# Patient Record
Sex: Female | Born: 1990 | Race: White | Hispanic: No | Marital: Married | Smoking: Never smoker
Health system: Southern US, Community
[De-identification: ages and names within clinical notes are randomized; demographics above are authoritative.]

## PROBLEM LIST (undated history)

## (undated) DIAGNOSIS — F329 Major depressive disorder, single episode, unspecified: Secondary | ICD-10-CM

## (undated) DIAGNOSIS — F32A Depression, unspecified: Secondary | ICD-10-CM

## (undated) HISTORY — DX: Major depressive disorder, single episode, unspecified: F32.9

## (undated) HISTORY — DX: Depression, unspecified: F32.A

---
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
## 2012-10-07 ENCOUNTER — Ambulatory Visit: Payer: Self-pay | Admitting: Family Medicine

## 2012-11-04 ENCOUNTER — Ambulatory Visit: Payer: Self-pay | Admitting: Family Medicine

## 2013-05-21 ENCOUNTER — Ambulatory Visit: Payer: Self-pay | Admitting: Family Medicine

## 2013-06-18 ENCOUNTER — Ambulatory Visit (INDEPENDENT_AMBULATORY_CARE_PROVIDER_SITE_OTHER): Payer: 59 | Admitting: Psychiatry

## 2013-06-18 ENCOUNTER — Encounter (INDEPENDENT_AMBULATORY_CARE_PROVIDER_SITE_OTHER): Payer: Self-pay

## 2013-06-18 ENCOUNTER — Encounter (HOSPITAL_COMMUNITY): Payer: Self-pay | Admitting: Psychiatry

## 2013-06-18 DIAGNOSIS — F329 Major depressive disorder, single episode, unspecified: Secondary | ICD-10-CM | POA: Insufficient documentation

## 2013-06-18 DIAGNOSIS — F411 Generalized anxiety disorder: Secondary | ICD-10-CM

## 2013-06-18 MED ORDER — MELATONIN 5 MG PO TABS: 5.0000 mg | ORAL_TABLET | Freq: Every evening | ORAL | Status: AC | PRN

## 2013-06-18 MED ORDER — CITALOPRAM HYDROBROMIDE 20 MG PO TABS: 20.0000 mg | ORAL_TABLET | Freq: Every day | ORAL | Status: DC
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
## 2013-06-18 NOTE — Patient Instructions (Signed)
Continue therapy at [facility]  Have labs faxed to us at [phone]

## 2013-08-20 ENCOUNTER — Ambulatory Visit (INDEPENDENT_AMBULATORY_CARE_PROVIDER_SITE_OTHER): Payer: 59 | Admitting: Psychiatry

## 2013-08-20 ENCOUNTER — Encounter (HOSPITAL_COMMUNITY): Payer: Self-pay | Admitting: Psychiatry

## 2013-08-20 VITALS — BP 125/76 | HR 95 | Ht 69.0 in | Wt 139.8 lb

## 2013-08-20 DIAGNOSIS — F341 Dysthymic disorder: Secondary | ICD-10-CM

## 2013-08-20 DIAGNOSIS — F3181 Bipolar II disorder: Secondary | ICD-10-CM | POA: Insufficient documentation

## 2013-08-20 MED ORDER — LITHIUM CARBONATE 300 MG PO TABS: 450.0000 mg | ORAL_TABLET | Freq: Every day | ORAL | Status: DC
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
## 2013-08-27 ENCOUNTER — Telehealth (HOSPITAL_COMMUNITY): Payer: Self-pay

## 2013-08-27 NOTE — Telephone Encounter (Signed)
Pt reports she researched lithium and feels scared b/c she doesn't want to be on it lifelong. Pt has not yet started taking.  A/P: MDD with anxiety, r/o Bipolar II 1. Pt will start Lithium as prescribed.

## 2013-10-22 ENCOUNTER — Ambulatory Visit (HOSPITAL_COMMUNITY): Payer: 59 | Admitting: Psychiatry

## 2013-10-29 ENCOUNTER — Encounter (HOSPITAL_COMMUNITY): Payer: Self-pay | Admitting: Psychiatry

## 2013-10-29 ENCOUNTER — Ambulatory Visit (INDEPENDENT_AMBULATORY_CARE_PROVIDER_SITE_OTHER): Payer: 59 | Admitting: Psychiatry

## 2013-10-29 DIAGNOSIS — F3181 Bipolar II disorder: Secondary | ICD-10-CM

## 2013-10-29 DIAGNOSIS — F341 Dysthymic disorder: Secondary | ICD-10-CM

## 2013-10-29 MED ORDER — LITHIUM CARBONATE 300 MG PO TABS: 600.0000 mg | ORAL_TABLET | Freq: Every day | ORAL | Status: DC

## 2013-10-29 NOTE — Progress Notes (Signed)
Patient ID: Beth Jacobson, female   DOB: 04-27-1990, 23 y.o.   MRN: [id number]  [facility] [id number] Progress Note  Beth Jacobson [id number] 23 y.o.  10/29/2013 3:43 PM  Chief Complaint: "things were good until school started"  History of Present Illness: Pt reports she was doing well until 3 weeks ago when school started. School work is going fine but socially things are not going well. 2 friends told her they are afraid of her and don't know what she will do. Pt denies violent behavior. No issues with other friends. Relationship with boyfriend is rocky and she is concerned he wants to break up with her.   Pt is in soccer and they are not playing her at games despite good performance. Pt is concerned b/c the Clydie Braun shared pt's mental health issues with the coach. She feels she is being targeted b/c of her depression. Pt is looking into options right now.  Depression is worse lately. Pt is having crying spells and feels helpless. Sleep, appetite and energy are good. Denies anhedonia and isolation.   Denies periods of mania and hypomania included decreased need for sleep and increased energy, elevated mood and impulsivity.   She is seeing a counselor every week and it is helping.   Pt is taking Lithium as prescribed and denies SE. States mood is more level and she is calmer.   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  She is studying biology and wants to be a Administrator, Civil Service. Parents live in [place] and she has one younger brother. Pt denies drug and nicotine use. She drinks a variable amount of alcohol socially. Endorsing a family hx of bipolar disorder and personality disorder. History reviewed. No pertinent past medical history.  Outpatient Encounter Prescriptions as of 10/29/2013  Medication Sig  . Dapsone (ACZONE) 5 % topical gel Apply 1 g topically daily.  Marland Kitchen lithium 300 MG tablet Take 1.5 tablets (450 mg total) by mouth daily.  . Melatonin 5 MG TABS Take 1 tablet (5 mg total) by mouth at bedtime as needed.  . norethindrone-ethinyl estradiol-iron (ESTROSTEP FE,TILIA FE,TRI-LEGEST FE) 1-20/1-30/1-35 MG-MCG tablet Take 1 tablet by mouth daily.    Past Psychiatric History/Hospitalization(s): Pt has a hx of cutting that last occurred in Feb 2015. Anxiety: No Bipolar Disorder: No Depression: No Mania: No Psychosis: No Schizophrenia: No Personality Disorder: No Hospitalization for psychiatric illness: No History of Electroconvulsive Shock Therapy: No Prior Suicide Attempts: No  Physical Exam: Constitutional:  There were no vitals taken for this visit.  General Appearance: alert, oriented, no acute distress and well nourished  Musculoskeletal: Strength & Muscle Tone: within normal limits Gait & Station: normal Patient leans: N/A  Mental Status Examination/Evaluation: Objective: Attitude: Calm and cooperative  Appearance: Casual, appears to be stated age  Eye Contact::  Good  Speech:  Clear and Coherent and Normal Rate  Volume:  Normal  Mood:  depressed  Affect:  Congruent and Tearful  Thought Process:  Circumstantial  Orientation:  Full (Time, Place, and Person)  Thought Content:  Negative  Suicidal Thoughts:  No  Homicidal Thoughts:  No  Judgement:  Other:  limited  Insight:  Shallow. Pt requires significant reassurance regarding her social issues  Concentration: good  Memory:  Immediate-good Recent-good Remote-good  Recall: fair  Language: fair  Gait and Station: normal  [employer] of Knowledge: average  Psychomotor Activity:  Normal  Akathisia:  No  Handed:  Right  AIMS (if indicated):  n/a    Medical Decision Making (Choose Three): Review of Psycho-Social Stressors (1), Established Problem, Worsening (2), Review of Medication Regimen & Side Effects (2) and Review of New Medication or Change in Dosage (2)  Assessment: AXIS I: MDD with anxiety; r/o Bipolar II disorder  AXIS II:  Deferred  AXIS III: History reviewed. No pertinent past medical history.  AXIS IV: other psychosocial or environmental problems  AXIS V: 61-70 mild symptoms    Plan:  Plan of Care:  Medication management with supportive therapy. Risks/benefits and SE of the medication discussed. Pt verbalized understanding and verbal consent obtained for treatment.  Affirm with the patient that the medications are taken as ordered. Patient expressed understanding of how their medications were to be used.  - worsening of symptoms    Laboratory: none at this time  Psychotherapy: Therapy: brief supportive therapy provided. Discussed psychosocial stressors in detail.  We had a long discussion about her relationship with her friends and boyfriend and talked about appropriate boundaries and healthy relationships in detail.   Medications:  Increase Lithium  po qD for mood stabalziation  Continue Melatonin OTC as needed for sleep as pt reports it is effective.   Routine PRN Medications: Yes   Consultations: encouraged to continue therapy.   Safety Concerns: Pt denies SI and is at an acute low risk for suicide.Patient told to call clinic if any problems occur. Patient advised to go to ER if they should develop SI/HI, side effects, or if symptoms worsen. Has crisis numbers to call if needed. Pt verbalized understanding.   Other: F/up in 3 months or sooner if needed     Oletta Darter,  MD 10/29/2013

## 2013-11-12 ENCOUNTER — Telehealth (HOSPITAL_COMMUNITY): Payer: Self-pay

## 2014-01-21 ENCOUNTER — Ambulatory Visit (INDEPENDENT_AMBULATORY_CARE_PROVIDER_SITE_OTHER): Payer: Commercial Managed Care - PPO | Admitting: Psychiatry

## 2014-01-21 ENCOUNTER — Encounter (HOSPITAL_COMMUNITY): Payer: Self-pay | Admitting: Psychiatry

## 2014-01-21 VITALS — BP 126/79 | HR 89 | Ht 69.0 in | Wt 144.8 lb

## 2014-01-21 DIAGNOSIS — F3181 Bipolar II disorder: Secondary | ICD-10-CM

## 2014-01-21 DIAGNOSIS — Z79899 Other long term (current) drug therapy: Secondary | ICD-10-CM

## 2014-01-21 MED ORDER — LITHIUM CARBONATE 300 MG PO TABS: 600.0000 mg | ORAL_TABLET | Freq: Every day | ORAL | Status: AC

## 2014-01-21 NOTE — Progress Notes (Signed)
[facility] [id number] [facility] Note  Beth Dickerrin Knock [id number] 23 y.o.  01/21/2014 3:19 PM  Chief Complaint: "pretty good"  History of Present Illness: States her soccer coach was concerned about her depression and pt's ability to perform. He wasn't playing her in games so pt decided to take the rest of the season off.  It was upsetting to her that her coach held her depression against her. Now she is feeling better. Pt broke up with her boyfriend and she is happier. Things are good with friends and she has downgraded some people to acquaints.   Depression comes and goes. This occurs about once a week. She has sad mood, low motivation, low energy, no desire to get out of bed, low appetite. It lasts for a few hours and resolves on its on.   Denies anhedonia, isolation, crying spells, withdrawal. Sleeping about 7-8 hrs and energy is good. Appetite and concentration are good.   Over thanksgiving she has 4-5 days of decreased sleep, increased energy, impulsive behavior, elevated mood, sexually inappropriate behavior. Symptoms resolved on its on.   She is seeing a counselor every week and it is helping.   Pt is taking Lithium as prescribed and denies SE. States mood is more level and she is calmer.   Suicidal Ideation: No Plan Formed: No Patient has means to carry out plan: No  Homicidal Ideation: No Plan Formed: No Patient has means to carry out plan: No  Review of Systems: Psychiatric: Agitation: No Hallucination: No Depressed Mood: No Insomnia: No Hypersomnia: No Altered Concentration: No Feels Worthless: No Grandiose Ideas: Yes Belief In Special Powers: No New/Increased Substance Abuse: No Compulsions: No  Neurologic: Headache: No Seizure: No Paresthesias: No   Review of Systems  Constitutional: Negative for fever and chills.  HENT: Negative for congestion, nosebleeds and sore throat.   Eyes: Negative for blurred vision, double vision and discharge.   Respiratory: Negative for cough, sputum production and shortness of breath.   Cardiovascular: Negative for chest pain, palpitations and leg swelling.  Gastrointestinal: Negative for heartburn, nausea, vomiting, abdominal pain, diarrhea and constipation.  Musculoskeletal: Negative for myalgias, back pain, joint pain and neck pain.  Skin: Negative for itching and rash.  Neurological: Negative for dizziness, tingling, seizures, loss of consciousness, weakness and headaches.  Psychiatric/Behavioral: Negative for depression, suicidal ideas, hallucinations and substance abuse. The patient is not nervous/anxious and does not have insomnia.      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 She is studying biology and wants to be a Administrator, [employer] Service[employer]et. [place] in [place] and she has one younger brother. Pt denies drug and nicotine use. She drinks a variable amount of alcohol socially. Endorsing a family hx of bipolar disorder and personality disorder. Past Medical History  Diagnosis Date  . Depression     Outpatient Encounter Prescriptions as of 01/21/2014  Medication Sig  . Dapsone (ACZONE) 5 % topical gel Apply 1 g topically daily.  Marland Kitchen. lithium 300 MG tablet Take 2 tablets (600 mg total) by mouth daily.  . Melatonin 5 MG TABS Take 1 tablet (5 mg total) by mouth at bedtime as needed.  . norethindrone-ethinyl estradiol-iron (ESTROSTEP FE,TILIA FE,TRI-LEGEST FE) 1-20/1-30/1-35 MG-MCG tablet Take 1 tablet by mouth daily.    Past Psychiatric History/Hospitalization(s): Pt has a hx of cutting that last occurred in Feb 2015. Anxiety: No Bipolar Disorder: No Depression: No Mania: No Psychosis: No Schizophrenia: No Personality Disorder: No Hospitalization  for psychiatric illness: No History of Electroconvulsive Shock Therapy: No Prior Suicide Attempts: No  Physical Exam: Constitutional:  There were no vitals taken for this visit.  General Appearance:  alert, oriented, no acute distress and well nourished  Musculoskeletal: Strength & Muscle Tone: within normal limits Gait & Station: normal Patient leans: N/A  Mental Status Examination/Evaluation: Objective: Attitude: Calm and cooperative  Appearance: Casual, appears to be stated age  Eye Contact::  Good  Speech:  Clear and Coherent and Normal Rate  Volume:  Normal  Mood:  euthymic  Affect:  Full Range  Thought Process:  Linear and Logical  Orientation:  Full (Time, Place, and Person)  Thought Content:  Negative  Suicidal Thoughts:  No  Homicidal Thoughts:  No  Judgement:  Good  Insight:  Fair  Concentration: good  Memory: Immediate-fair Recent-fair Remote-fair  Recall: fair  Language: fair  Gait and Station: normal  [employer] of Knowledge: average  Psychomotor Activity:  Normal  Akathisia:  No  Handed:  Right  AIMS (if indicated):  n/a  Assets:  Manufacturing systems engineerCommunication Skills Desire for Improvement Financial Resources/Insurance Housing Leisure Time Physical Health Resilience Social Support BaristaTalents/Skills Transportation Vocational/Educational    Medical Decision Making (Choose Three): Established Problem, Stable/Improving (1), Review of Psycho-Social Stressors (1), Review or order clinical lab tests (1) and Review of Medication Regimen & Side Effects (2)  Assessment: AXIS I: Bipolar II disorder - current episode unspecifiend AXIS II:  Deferred  AXIS III:  Past Medical History  Diagnosis Date  . Depression    AXIS IV: other psychosocial or environmental problems  AXIS V: 61-70 mild symptoms    Plan:  Plan of Care:  Medication management with supportive therapy. Risks/benefits and SE of the medication discussed. Pt verbalized understanding and verbal consent obtained for treatment.  Affirm with the patient that the medications are taken as ordered. Patient expressed understanding of how their medications were to be used.  - improvement of symptoms     Laboratory: Lithium- Lithium level, CMP with Bun/Creatinine, TSH   Psychotherapy: Therapy: brief supportive therapy provided. Discussed psychosocial stressors in detail.  We had a long discussion about her relationship with her friends and boyfriend and talked about appropriate boundaries and healthy relationships in detail.   Medications:  Lithium 600mg  po qD for mood stabalziation  Continue Melatonin OTC as needed for sleep as pt reports it is effective.   Routine PRN Medications: Yes   Consultations: encouraged to continue therapy.   Safety Concerns: Pt denies SI and is at an acute low risk for suicide.Patient told to call clinic if any problems occur. Patient advised to go to ER if they should develop SI/HI, side effects, or if symptoms worsen. Has crisis numbers to call if needed. Pt verbalized understanding.   Other: F/up in 3 months or sooner if needed   Pt asking for letter for soccer- due to MH issues pt was not able to finish out her season. Pt is giving permission disclose diagnosis, treatment, symptoms.     Oletta DarterAGARWAL, Ngina Royer, MD 01/21/2014

## 2014-02-05 ENCOUNTER — Telehealth (HOSPITAL_COMMUNITY): Payer: Self-pay

## 2014-02-08 ENCOUNTER — Encounter (HOSPITAL_COMMUNITY): Payer: Self-pay | Admitting: *Deleted

## 2014-02-09 NOTE — Telephone Encounter (Signed)
Spoke with pt who stated letter needed clarification regarding her opting out of this years soccer team.   Discussed with pt and rewrote the letter with her approval.

## 2014-06-08 ENCOUNTER — Ambulatory Visit
Admit: 2014-06-08 | Disposition: A | Discharge status: Home / Self Care | Payer: Self-pay | Attending: Family Medicine | Admitting: Family Medicine

## 2014-09-24 DIAGNOSIS — M62838 Other muscle spasm: Secondary | ICD-10-CM | POA: Diagnosis not present

## 2014-09-24 DIAGNOSIS — M545 Low back pain: Secondary | ICD-10-CM | POA: Diagnosis not present

## 2014-12-14 ENCOUNTER — Ambulatory Visit
Admission: RE | Admit: 2014-12-14 | Discharge: 2014-12-14 | Disposition: A | Discharge status: Home / Self Care | Payer: Commercial Managed Care - PPO | Source: Ambulatory Visit | Attending: Family Medicine | Admitting: Family Medicine

## 2014-12-14 ENCOUNTER — Other Ambulatory Visit: Payer: Self-pay | Admitting: Family Medicine

## 2014-12-14 DIAGNOSIS — M25561 Pain in right knee: Secondary | ICD-10-CM | POA: Insufficient documentation

## 2014-12-14 DIAGNOSIS — S20219A Contusion of unspecified front wall of thorax, initial encounter: Secondary | ICD-10-CM | POA: Diagnosis not present

## 2014-12-14 DIAGNOSIS — R52 Pain, unspecified: Secondary | ICD-10-CM

## 2014-12-14 DIAGNOSIS — R0781 Pleurodynia: Secondary | ICD-10-CM | POA: Insufficient documentation

## 2014-12-21 DIAGNOSIS — S20219A Contusion of unspecified front wall of thorax, initial encounter: Secondary | ICD-10-CM | POA: Diagnosis not present

## 2014-12-21 DIAGNOSIS — M25561 Pain in right knee: Secondary | ICD-10-CM | POA: Diagnosis not present

## 2014-12-23 ENCOUNTER — Other Ambulatory Visit: Payer: Self-pay | Admitting: Family Medicine

## 2014-12-23 DIAGNOSIS — M25561 Pain in right knee: Secondary | ICD-10-CM

## 2015-01-03 ENCOUNTER — Ambulatory Visit: Payer: Commercial Managed Care - PPO

## 2017-03-30 IMAGING — CR DG KNEE COMPLETE 4+V*R*
1 series · 4 of 4 positions shown · non-contrast
Comparison: None.

CLINICAL DATA: Medial right knee pain after soccer injury 3 weeks
ago. Initial encounter.

EXAM:
RIGHT KNEE - COMPLETE 4+ VIEW

[Series 1: dg knee complete 4 views right · 0.14mm/px · 4 of 4 slices shown]
[im 1/4]
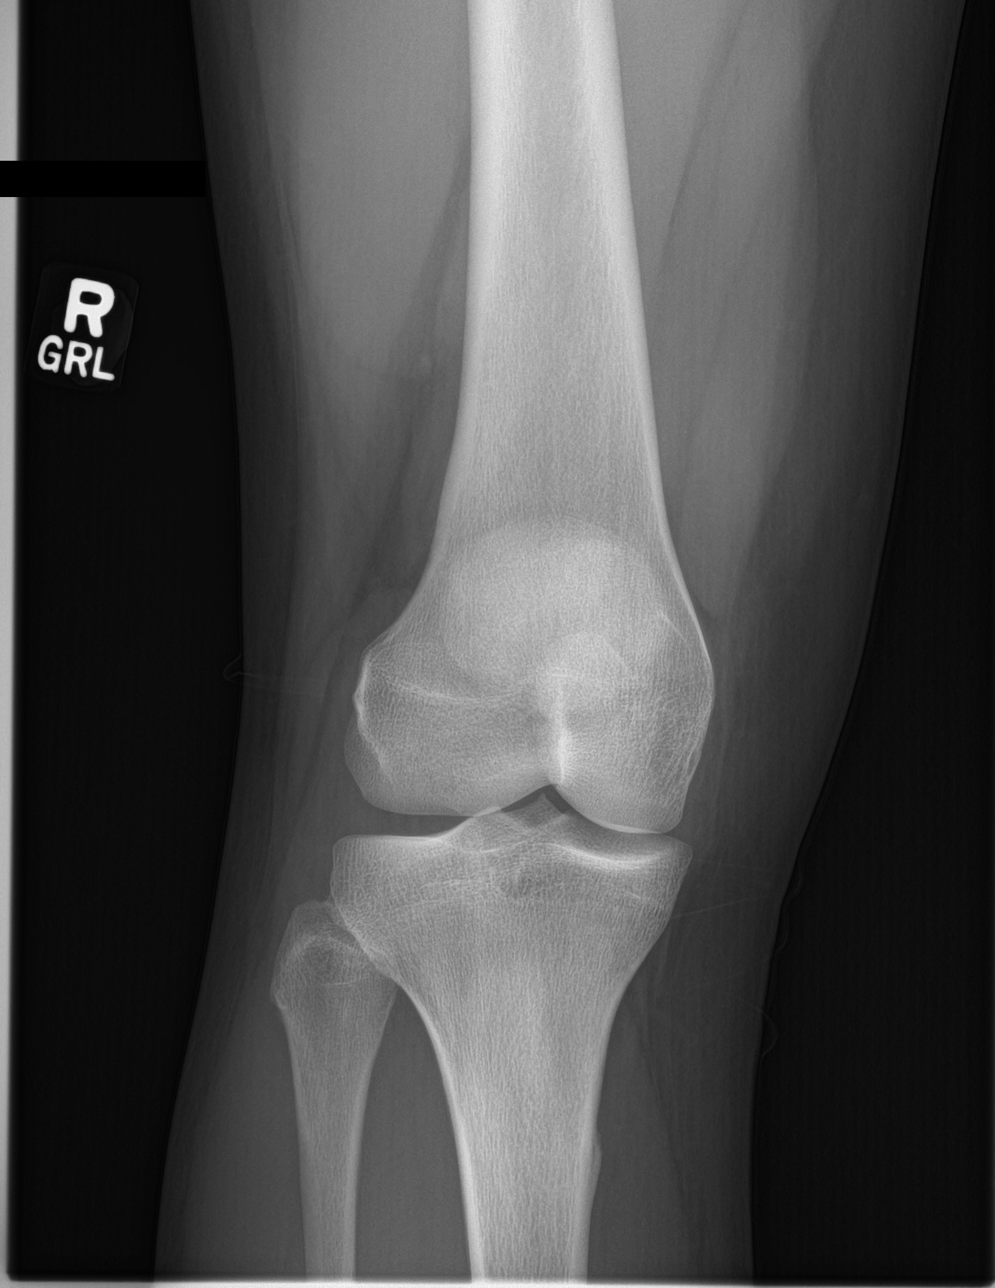
[im 2/4]
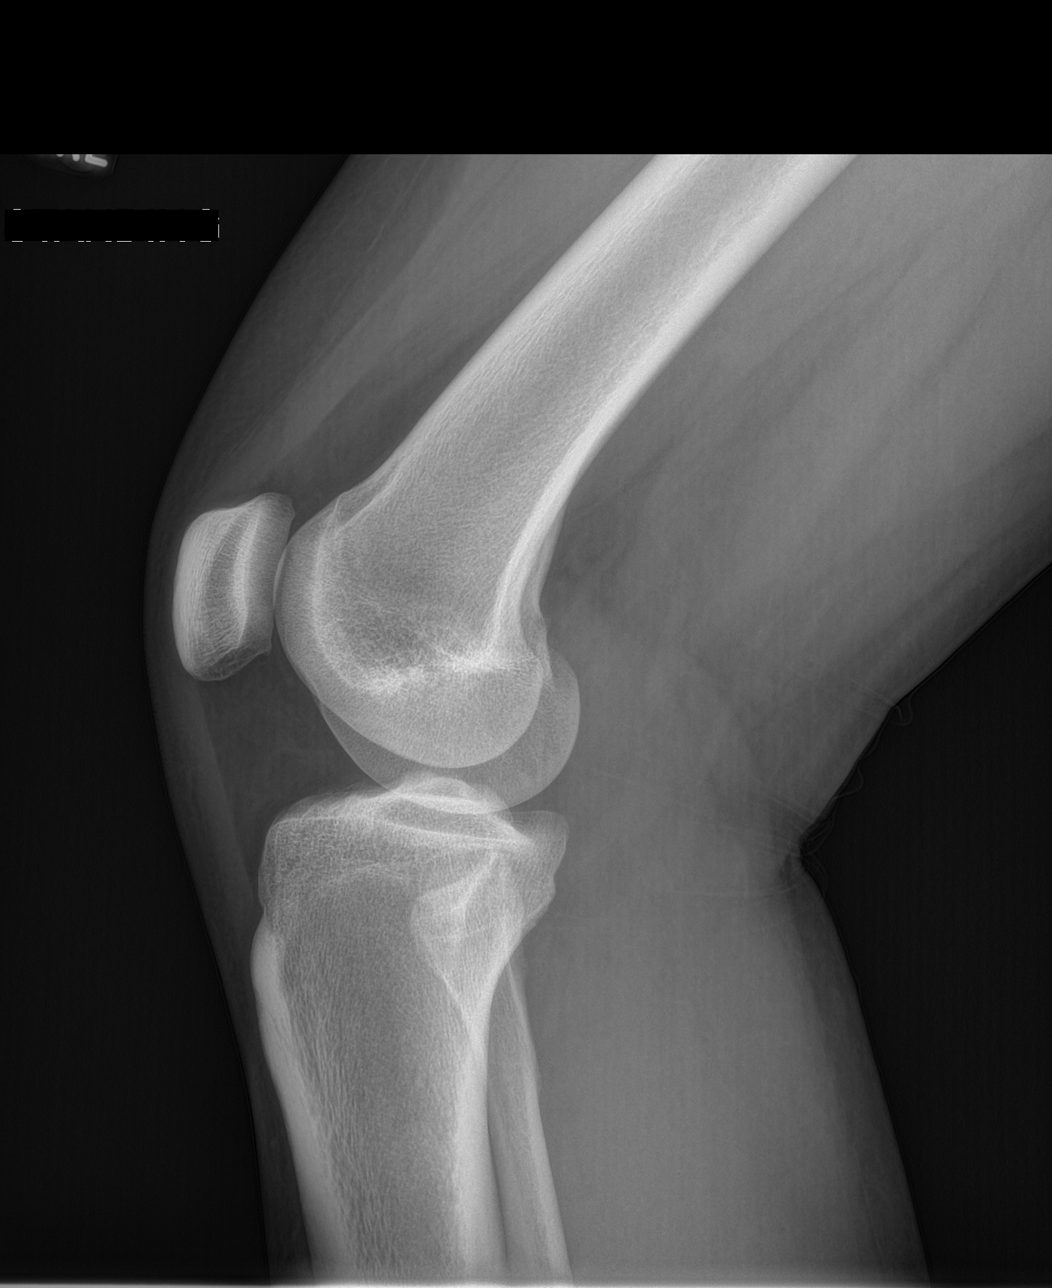
[im 3/4]
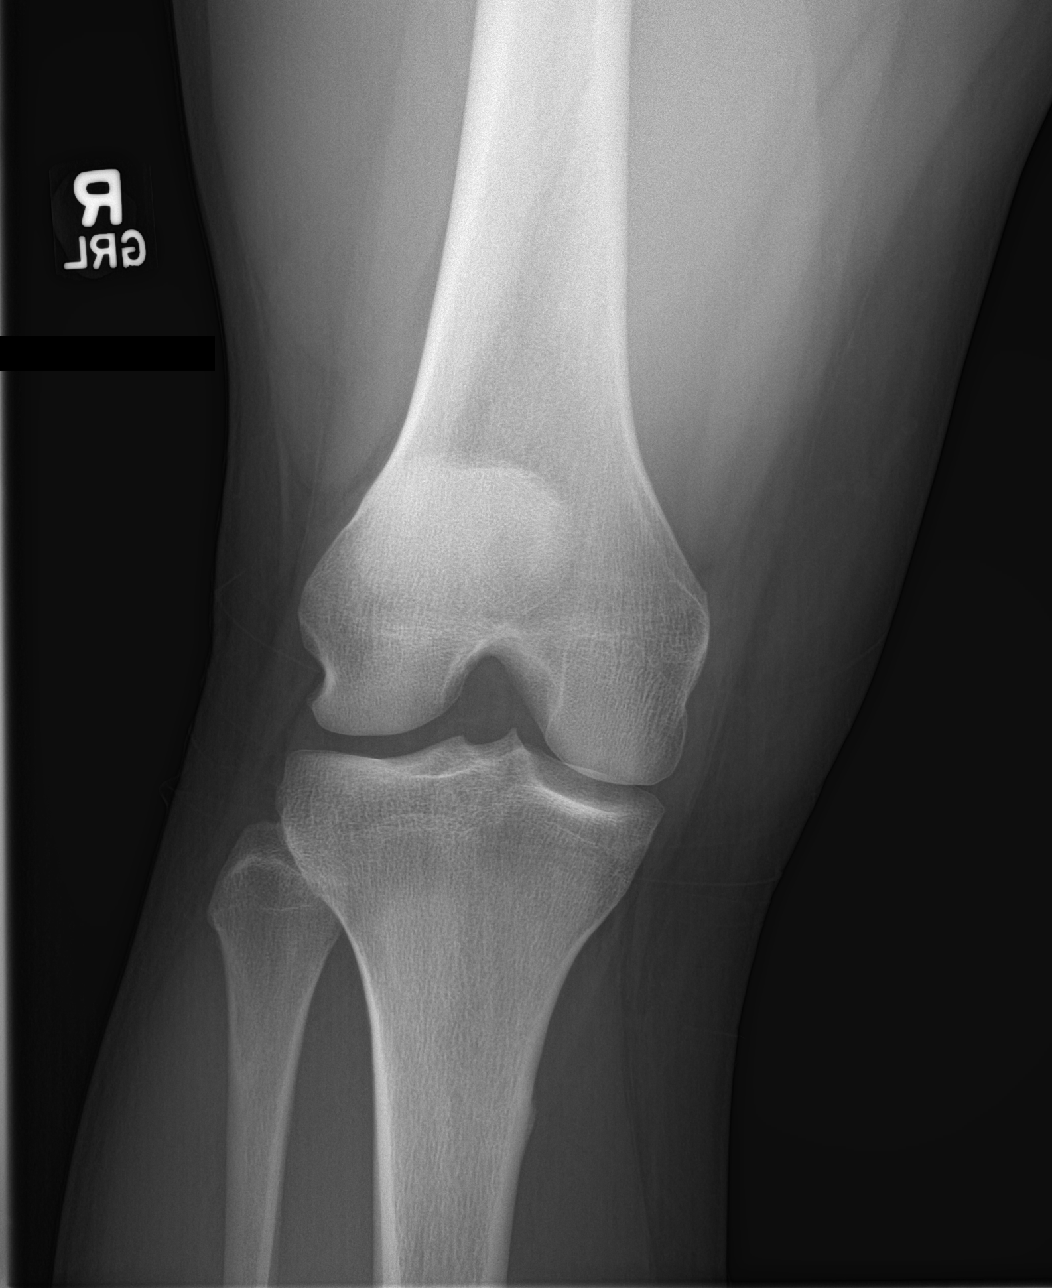
[im 4/4]
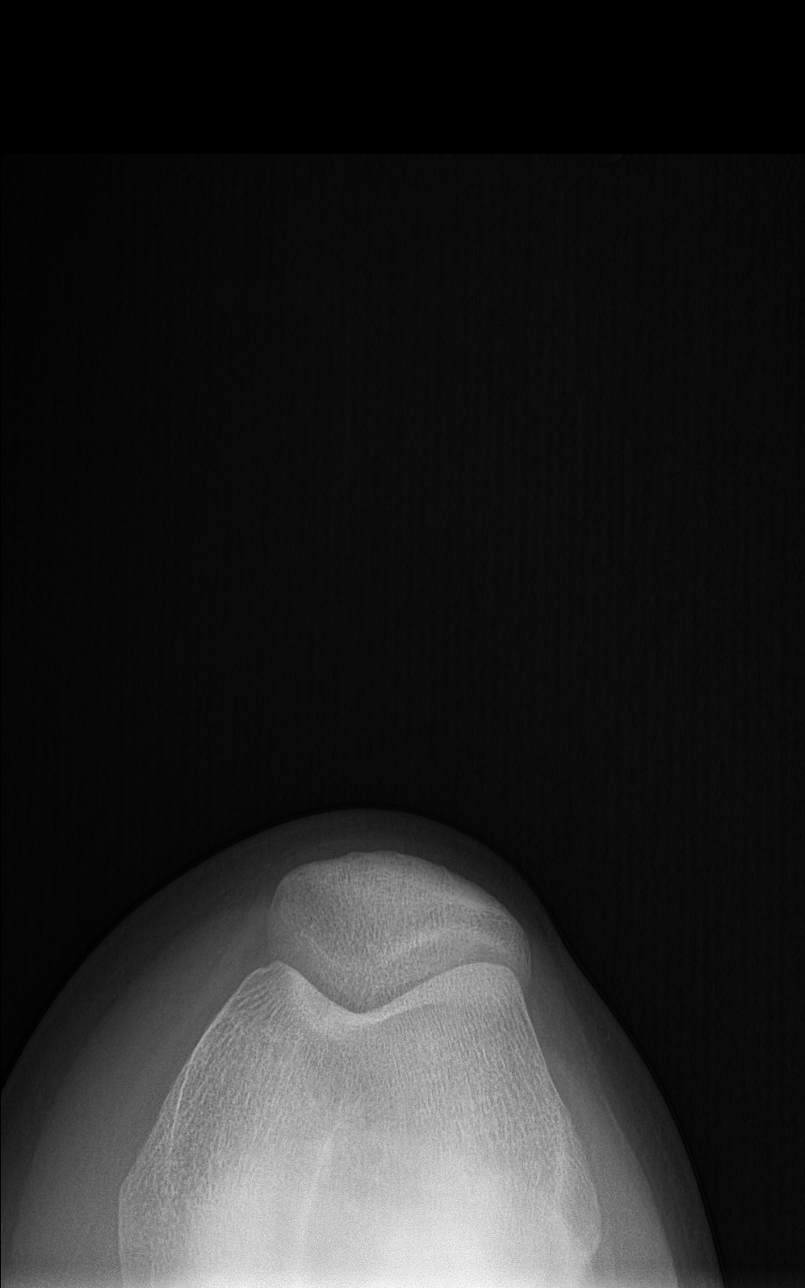

[4 of 4 positions shown; findings below may reference images not displayed]

FINDINGS: There is no evidence of fracture, dislocation, or joint effusion.
There is no evidence of arthropathy or other focal bone abnormality.
Soft tissues are unremarkable.
IMPRESSION: Negative.
